# Patient Record
Sex: Female | Born: 1945 | Race: White | Hispanic: No | Marital: Married | Smoking: Never smoker
Health system: Southern US, Community
[De-identification: ages and names within clinical notes are randomized; demographics above are authoritative.]

## PROBLEM LIST (undated history)

## (undated) DIAGNOSIS — E7800 Pure hypercholesterolemia, unspecified: Secondary | ICD-10-CM

---

## 1999-07-24 ENCOUNTER — Ambulatory Visit (HOSPITAL_BASED_OUTPATIENT_CLINIC_OR_DEPARTMENT_OTHER): Admission: RE | Admit: 1999-07-24 | Discharge: 1999-07-24 | Payer: Self-pay | Admitting: Orthopedic Surgery

## 2013-02-04 ENCOUNTER — Ambulatory Visit (INDEPENDENT_AMBULATORY_CARE_PROVIDER_SITE_OTHER): Payer: BC Managed Care – PPO | Admitting: General Surgery

## 2013-02-28 ENCOUNTER — Encounter (HOSPITAL_COMMUNITY): Payer: Self-pay | Admitting: Emergency Medicine

## 2013-02-28 ENCOUNTER — Emergency Department (HOSPITAL_COMMUNITY)
Admission: EM | Admit: 2013-02-28 | Discharge: 2013-03-01 | Disposition: A | Discharge status: Home / Self Care | Payer: BC Managed Care – PPO | Attending: Emergency Medicine | Admitting: Emergency Medicine

## 2013-02-28 ENCOUNTER — Emergency Department (HOSPITAL_COMMUNITY): Payer: BC Managed Care – PPO

## 2013-02-28 DIAGNOSIS — R0602 Shortness of breath: Secondary | ICD-10-CM | POA: Insufficient documentation

## 2013-02-28 DIAGNOSIS — Z7982 Long term (current) use of aspirin: Secondary | ICD-10-CM | POA: Insufficient documentation

## 2013-02-28 DIAGNOSIS — R0989 Other specified symptoms and signs involving the circulatory and respiratory systems: Secondary | ICD-10-CM | POA: Insufficient documentation

## 2013-02-28 DIAGNOSIS — E7800 Pure hypercholesterolemia, unspecified: Secondary | ICD-10-CM | POA: Insufficient documentation

## 2013-02-28 DIAGNOSIS — R079 Chest pain, unspecified: Secondary | ICD-10-CM | POA: Insufficient documentation

## 2013-02-28 DIAGNOSIS — Z79899 Other long term (current) drug therapy: Secondary | ICD-10-CM | POA: Insufficient documentation

## 2013-02-28 DIAGNOSIS — R0609 Other forms of dyspnea: Secondary | ICD-10-CM | POA: Insufficient documentation

## 2013-02-28 DIAGNOSIS — Z880 Allergy status to penicillin: Secondary | ICD-10-CM | POA: Insufficient documentation

## 2013-02-28 DIAGNOSIS — R030 Elevated blood-pressure reading, without diagnosis of hypertension: Secondary | ICD-10-CM | POA: Insufficient documentation

## 2013-02-28 HISTORY — DX: Pure hypercholesterolemia, unspecified: E78.00

## 2013-02-28 LAB — CBC
HCT: 37 % (ref 36.0–46.0)
Hemoglobin: 12.9 g/dL (ref 12.0–15.0)
MCHC: 34.9 g/dL (ref 30.0–36.0)
MCV: 94.1 fL (ref 78.0–100.0)

## 2013-02-28 LAB — BASIC METABOLIC PANEL
BUN: 18 mg/dL (ref 6–23)
Creatinine, Ser: 0.71 mg/dL (ref 0.50–1.10)
GFR calc non Af Amer: 88 mL/min — ABNORMAL LOW (ref >90)
Glucose, Bld: 120 mg/dL — ABNORMAL HIGH (ref 70–99)
Potassium: 3.9 mEq/L (ref 3.5–5.1)

## 2013-02-28 NOTE — ED Provider Notes (Signed)
Patient had 1 minute of anterior chest pain this evening while bending over. No shortness of breath no nausea no sweatiness. Patient asymptomatic presently. No weakness. Her exam no distress lungs clear auscultation heart regular in rhythm no murmurs abdomen obese nontender  Doug Sou, MD 02/28/13 2355

## 2013-02-28 NOTE — ED Notes (Signed)
Pt reports 1 min episode of chest pressure 30 min ago.  Denies sob, nausea, and vomiting.  Reports generalized weakness.  Similar episode 3 weeks ago.

## 2013-02-28 NOTE — ED Provider Notes (Signed)
   History    CSN: [id number] Arrival date & time 02/28/13  2021  First MD Initiated Contact with Patient 02/28/13 2128     Chief Complaint  Patient presents with  . Chest Pain   (Consider location/radiation/quality/duration/timing/severity/associated sxs/prior Treatment) HPI Past Medical History  Diagnosis Date  . High cholesterol    History reviewed. No pertinent past surgical history. No family history on file. History  Substance Use Topics  . Smoking status: Never Smoker   . Smokeless tobacco: Not on file  . Alcohol Use: No   OB History   Grav Para Term Preterm Abortions TAB SAB Ect Mult Living                 Review of Systems  Allergies  Penicillins  Home Medications   Current Outpatient Rx  Name  Route  Sig  Dispense  Refill  . aspirin EC 81 MG tablet   Oral   Take 81 mg by mouth daily.         . diphenhydrAMINE (BENADRYL) 25 MG tablet   Oral   Take 50 mg by mouth at bedtime as needed for itching, allergies or sleep.         Marland Kitchen ibuprofen (ADVIL,MOTRIN) 200 MG tablet   Oral   Take 600 mg by mouth daily as needed for pain.         Marland Kitchen loratadine (CLARITIN) 10 MG tablet   Oral   Take 20 mg by mouth daily as needed for allergies.         . naproxen sodium (ANAPROX) 220 MG tablet   Oral   Take 440 mg by mouth daily as needed (pain).         . pravastatin (PRAVACHOL) 40 MG tablet   Oral   Take 40 mg by mouth daily.         Marland Kitchen triamcinolone cream (KENALOG) 0.1 %   Topical   Apply 1 application topically daily as needed (itching).          BP 152/86  Pulse 78  Temp(Src) 98 F (36.7 C) (Oral)  Resp 25  SpO2 96% Physical Exam  ED Course  Procedures (including critical care time) Labs Reviewed  BASIC METABOLIC PANEL - Abnormal; Notable for the following:    Glucose, Bld 120 (*)    GFR calc non Af Amer 88 (*)    All other components within normal limits  CBC  POCT I-STAT TROPONIN I   Dg Chest 2 View  02/28/2013   *RADIOLOGY  REPORT*  Clinical Data: Chest pressure  CHEST - 2 VIEW  Comparison: None.  Findings: Cardiac and mediastinal silhouettes are normal limits.  Lungs are well inflated.  No consolidation, pleural effusion, or pulmonary edema.  No pneumothorax.  Multilevel degenerative changes noted within the mid thoracic spine.  No acute osseous abnormality.  IMPRESSION: No acute cardiopulmonary process.   Original Report Authenticated By: Rise Mu, M.D.   No diagnosis found.  MDM  Duplicate noteuplicate note. Please see my other note from this encounter  Doug Sou, MD 03/01/13 [phone]

## 2013-03-01 LAB — POCT I-STAT TROPONIN I: Troponin i, poc: 0.01 ng/mL (ref 0.00–0.08)

## 2013-03-01 NOTE — ED Provider Notes (Signed)
History    CSN: [id number] Arrival date & time 02/28/13  2021  First MD Initiated Contact with Patient 02/28/13 2128     Chief Complaint  Patient presents with  . Chest Pain   (Consider location/radiation/quality/duration/timing/severity/associated sxs/prior Treatment) HPI Comments: Pt w/ PMHx of HLD now w/ chest pain. States acute onset while leaning forward to roll up window, substernal pressure, lasting 2 minutes and resolved spontaneously. A/w dyspnea. Similar sx 3 wks ago. Denies dyspnea or chest pain on exertion. No hx of CAD, no prior cath or stress test. No recent fever, cough or trauma. No hx of dvt/pe. On arrival to ED pt denies pain or any complaints.   Patient is a 67 y.o. female presenting with general illness. The history is provided by the patient. No language interpreter was used.  Illness Location:  Cardio/pulm Quality:  Chest pain Severity:  Moderate Onset quality:  Sudden Duration: 2 minutes. Timing:  Intermittent Progression:  Worsening Chronicity:  New Associated symptoms: chest pain and shortness of breath   Associated symptoms: no abdominal pain, no congestion, no cough, no diarrhea, no fever, no headaches, no nausea, no rash, no sore throat and no vomiting    Past Medical History  Diagnosis Date  . High cholesterol    History reviewed. No pertinent past surgical history. No family history on file. History  Substance Use Topics  . Smoking status: Never Smoker   . Smokeless tobacco: Not on file  . Alcohol Use: No   OB History   Grav Para Term Preterm Abortions TAB SAB Ect Mult Living                 Review of Systems  Constitutional: Negative for fever and chills.  HENT: Negative for congestion and sore throat.   Respiratory: Positive for shortness of breath. Negative for cough.   Cardiovascular: Positive for chest pain. Negative for leg swelling.  Gastrointestinal: Negative for nausea, vomiting, abdominal pain, diarrhea and constipation.   Genitourinary: Negative for dysuria and frequency.  Skin: Negative for color change and rash.  Neurological: Negative for dizziness and headaches.  Psychiatric/Behavioral: Negative for confusion and agitation.  All other systems reviewed and are negative.    Allergies  Penicillins  Home Medications   Current Outpatient Rx  Name  Route  Sig  Dispense  Refill  . aspirin EC 81 MG tablet   Oral   Take 81 mg by mouth daily.         . diphenhydrAMINE (BENADRYL) 25 MG tablet   Oral   Take 50 mg by mouth at bedtime as needed for itching, allergies or sleep.         Marland Kitchen ibuprofen (ADVIL,MOTRIN) 200 MG tablet   Oral   Take 600 mg by mouth daily as needed for pain.         Marland Kitchen loratadine (CLARITIN) 10 MG tablet   Oral   Take 20 mg by mouth daily as needed for allergies.         . naproxen sodium (ANAPROX) 220 MG tablet   Oral   Take 440 mg by mouth daily as needed (pain).         . pravastatin (PRAVACHOL) 40 MG tablet   Oral   Take 40 mg by mouth daily.         Marland Kitchen triamcinolone cream (KENALOG) 0.1 %   Topical   Apply 1 application topically daily as needed (itching).          BP  160/80  Pulse 88  Temp(Src) 98 F (36.7 C) (Oral)  Resp 26  SpO2 96% Physical Exam  Vitals reviewed. Constitutional: She is oriented to person, place, and time. She appears well-developed and well-nourished. No distress.  HENT:  Head: Normocephalic and atraumatic.  Eyes: EOM are normal. Pupils are equal, round, and reactive to light.  Neck: Normal range of motion. Neck supple.  Cardiovascular: Normal rate and regular rhythm.   Pulses:      Radial pulses are 2+ on the right side, and 2+ on the left side.  Pulmonary/Chest: Effort normal and breath sounds normal. No respiratory distress.  Abdominal: Soft. She exhibits no distension.  Musculoskeletal: Normal range of motion. She exhibits no edema.       Right lower leg: She exhibits no edema.       Left lower leg: She exhibits no  edema.  Neurological: She is alert and oriented to person, place, and time.  Skin: Skin is warm and dry.  Psychiatric: She has a normal mood and affect. Her behavior is normal.    ED Course  Procedures (including critical care time) Results for orders placed during the hospital encounter of 02/28/13  CBC      Result Value Range   WBC 8.9  4.0 - 10.5 K/uL   RBC 3.93  3.87 - 5.11 MIL/uL   Hemoglobin 12.9  12.0 - 15.0 g/dL   HCT 16.1  09.6 - 04.5 %   MCV 94.1  78.0 - 100.0 fL   MCH 32.8  26.0 - 34.0 pg   MCHC 34.9  30.0 - 36.0 g/dL   RDW 40.9  81.1 - 91.4 %   Platelets 292  150 - 400 K/uL  BASIC METABOLIC PANEL      Result Value Range   Sodium 140  135 - 145 mEq/L   Potassium 3.9  3.5 - 5.1 mEq/L   Chloride 102  96 - 112 mEq/L   CO2 26  19 - 32 mEq/L   Glucose, Bld 120 (*) 70 - 99 mg/dL   BUN 18  6 - 23 mg/dL   Creatinine, Ser 7.82  0.50 - 1.10 mg/dL   Calcium 9.5  8.4 - 95.6 mg/dL   GFR calc non Af Amer 88 (*) >90 mL/min   GFR calc Af Amer >90  >90 mL/min  POCT I-STAT TROPONIN I      Result Value Range   Troponin i, poc 0.02  0.00 - 0.08 ng/mL   Comment 3           POCT I-STAT TROPONIN I      Result Value Range   Troponin i, poc 0.01  0.00 - 0.08 ng/mL   Comment 3             Date: 03/01/2013  Rate: 97  Rhythm: normal sinus rhythm  QRS Axis: normal  Intervals: normal  ST/T Wave abnormalities: normal  Conduction Disutrbances:none  Narrative Interpretation: LVH  Old EKG Reviewed: none available    Dg Chest 2 View  02/28/2013   *RADIOLOGY REPORT*  Clinical Data: Chest pressure  CHEST - 2 VIEW  Comparison: None.  Findings: Cardiac and mediastinal silhouettes are normal limits.  Lungs are well inflated.  No consolidation, pleural effusion, or pulmonary edema.  No pneumothorax.  Multilevel degenerative changes noted within the mid thoracic spine.  No acute osseous abnormality.  IMPRESSION: No acute cardiopulmonary process.   Original Report Authenticated By: Rise Mu, M.D.   1. Chest pain  MDM  Exam as above, NAD, chest pain free, mild HTN. Exam benign. 3hr troponin x 2 negative, ECG w/out acute ischemia. CXR - NACPF, labs otherwise unremarkable. Does not appear to be ACS. Doubt PE - low risk Wells, PERC + only for age. Chest pain is atypical and pt is low risk - will recommend fup w/ pcp for HTN, given referral to cardiology for outpt stress test. Stable for d/c home. Given return precautions and follow up instructions.  I have personally reviewed labs and imaging and considered in my MDM. Case d/w Dr Ethelda Chick  1. Chest pain    [facility] CARDIOLOGY [address] [place] [address] [phone] Schedule an appointment as soon as possible for a visit for exercise stress test       Audelia Hives, MD 03/01/13 [phone]

## 2013-03-01 NOTE — ED Provider Notes (Signed)
I have personally seen and examined the patient.  I have discussed the plan of care with the resident.  I have reviewed the documentation on PMH/FH/Soc. History.  I have reviewed the documentation of the resident and agree.  Jahari Billy, MD 03/01/13 0146 

## 2013-08-19 ENCOUNTER — Other Ambulatory Visit: Payer: Self-pay | Admitting: Occupational Medicine

## 2013-08-19 ENCOUNTER — Ambulatory Visit: Payer: Self-pay

## 2013-08-19 DIAGNOSIS — M25562 Pain in left knee: Secondary | ICD-10-CM

## 2015-10-24 ENCOUNTER — Encounter (HOSPITAL_COMMUNITY): Payer: Self-pay

## 2015-10-24 ENCOUNTER — Emergency Department (HOSPITAL_COMMUNITY): Payer: Medicare PPO

## 2015-10-24 ENCOUNTER — Emergency Department (HOSPITAL_COMMUNITY)
Admission: EM | Admit: 2015-10-24 | Discharge: 2015-10-24 | Disposition: A | Discharge status: Home / Self Care | Payer: Medicare PPO | Attending: Emergency Medicine | Admitting: Emergency Medicine

## 2015-10-24 DIAGNOSIS — Z7982 Long term (current) use of aspirin: Secondary | ICD-10-CM | POA: Diagnosis not present

## 2015-10-24 DIAGNOSIS — J3489 Other specified disorders of nose and nasal sinuses: Secondary | ICD-10-CM | POA: Diagnosis not present

## 2015-10-24 DIAGNOSIS — R509 Fever, unspecified: Secondary | ICD-10-CM | POA: Insufficient documentation

## 2015-10-24 DIAGNOSIS — Z79899 Other long term (current) drug therapy: Secondary | ICD-10-CM | POA: Insufficient documentation

## 2015-10-24 DIAGNOSIS — Z7951 Long term (current) use of inhaled steroids: Secondary | ICD-10-CM | POA: Diagnosis not present

## 2015-10-24 DIAGNOSIS — R059 Cough, unspecified: Secondary | ICD-10-CM

## 2015-10-24 DIAGNOSIS — R05 Cough: Secondary | ICD-10-CM | POA: Diagnosis not present

## 2015-10-24 DIAGNOSIS — Z880 Allergy status to penicillin: Secondary | ICD-10-CM | POA: Insufficient documentation

## 2015-10-24 DIAGNOSIS — R0981 Nasal congestion: Secondary | ICD-10-CM | POA: Insufficient documentation

## 2015-10-24 DIAGNOSIS — E7800 Pure hypercholesterolemia, unspecified: Secondary | ICD-10-CM | POA: Insufficient documentation

## 2015-10-24 MED ORDER — FLUTICASONE PROPIONATE 50 MCG/ACT NA SUSP: 2.0000 | Freq: Every day | NASAL | Status: AC

## 2015-10-24 MED ORDER — AZITHROMYCIN 250 MG PO TABS: 250.0000 mg | ORAL_TABLET | Freq: Every day | ORAL | Status: AC

## 2015-10-24 MED ORDER — BENZONATATE 100 MG PO CAPS: 100.0000 mg | ORAL_CAPSULE | Freq: Three times a day (TID) | ORAL | Status: AC

## 2015-10-24 NOTE — ED Notes (Signed)
Called x 2.  No answer.  Checked in SW and main waiting.

## 2015-10-24 NOTE — Discharge Instructions (Signed)
You likely have a virus. However, I will give you a z-pack to hold on to. You may take it if your symptoms worsen or do not improve by the weekend. I will also give you a couple prescriptions to help with your congestion and cough.  Take medications as prescribed. Return to the emergency room for worsening condition or new concerning symptoms. Follow up with your regular doctor. If you don't have a regular doctor use one of the numbers below to establish a primary care doctor.   Emergency Department Resource Guide 1) Find a Doctor and Pay Out of Pocket Although you won't have to find out who is covered by your insurance plan, it is a good idea to ask around and get recommendations. You will then need to call the office and see if the doctor you have chosen will accept you as a new patient and what types of options they offer for patients who are self-pay. Some doctors offer discounts or will set up payment plans for their patients who do not have insurance, but you will need to ask so you aren't surprised when you get to your appointment.  2) Contact Your Local Health Department Not all health departments have doctors that can see patients for sick visits, but many do, so it is worth a call to see if yours does. If you don't know where your local health department is, you can check in your phone book. The CDC also has a tool to help you locate your state's health department, and many state websites also have listings of all of their local health departments.  3) Find a Walk-in Clinic If your illness is not likely to be very severe or complicated, you may want to try a walk in clinic. These are popping up all over the country in pharmacies, drugstores, and shopping centers. They're usually staffed by nurse practitioners or physician assistants that have been trained to treat common illnesses and complaints. They're usually fairly quick and inexpensive. However, if you have serious medical issues or  chronic medical problems, these are probably not your best option.  No Primary Care Doctor: - Call Health Connect at  [phone] - they can help you locate a primary care doctor that  accepts your insurance, provides certain services, etc. - Physician Referral [phone]  Emergency Department Resource Guide 1) Find a Doctor and Pay Out of Pocket Although you won't have to find out who is covered by your insurance plan, it is a good idea to ask around and get recommendations. You will then need to call the office and see if the doctor you have chosen will accept you as a new patient and what types of options they offer for patients who are self-pay. Some doctors offer discounts or will set up payment plans for their patients who do not have insurance, but you will need to ask so you aren't surprised when you get to your appointment.  2) Contact Your Local Health Department Not all health departments have doctors that can see patients for sick visits, but many do, so it is worth a call to see if yours does. If you don't know where your local health department is, you can check in your phone book. The CDC also has a tool to help you locate your state's health department, and many state websites also have listings of all of their local health departments.  3) Find a Walk-in Clinic If your illness is not likely to be very severe  or complicated, you may want to try a walk in clinic. These are popping up all over the country in pharmacies, drugstores, and shopping centers. They're usually staffed by nurse practitioners or physician assistants that have been trained to treat common illnesses and complaints. They're usually fairly quick and inexpensive. However, if you have serious medical issues or chronic medical problems, these are probably not your best option.  No Primary Care Doctor: - Call Health Connect at  [phone] - they can help you locate a primary care doctor that  accepts your  insurance, provides certain services, etc. - Physician Referral Service- [phone]  Chronic Pain Problems: Organization         Address  Phone   Notes  [facility]  [phone] Patients need to be referred by their primary care doctor.   Medication Assistance: Organization         Address  Phone   Notes  [facility] Medication [facility] [address] [place], [place] [address] [phone] --Must be a resident of [facility] -- Must have NO insurance coverage whatsoever (no Medicaid/ Medicare, etc.) -- The pt. MUST have a primary care doctor that directs their care regularly and follows them in the community   [facility]  [phone]   [employer]  [phone]    Agencies that provide inexpensive medical care: Organization         Address  Phone   Notes  [facility]  [phone]   [facility]    [phone]   [facility] [address] [place], [place] [address] [phone]   [facility] [address] [place], [place] [phone]   [facility]    [phone]   [facility]    [phone]   [facility]  [address], [place] Phone:  [phone], Fax:  [phone] Hours of Operation:  9 am - 6 pm, M-F.  Also accepts Medicaid/Medicare and self-pay.  [facility]  [address], [place] Phone: [phone], Fax: [phone]. Hours of Operation:  8:30 am - 5:30 pm, M-F.  Also accepts Medicaid and self-pay.  [facility] [place] [address], [place] Phone: [phone]   [facility] [address] Natasha Bence [place], [place] [phone], Ext. [phone] Mondays & Thursdays: 7-9 AM.  First 15 patients are seen on a first come, first serve basis.    Medicaid-accepting [facility] Providers:  Organization          Address  Phone   Notes  [facility] [address], [place] [phone] Also accepts self-pay patients.  [facility] [address] Laurell Josephs [place], [place]  [phone]   [facility] [address], [place] [phone]   [facility] [address], [place] [phone]   Renaye Rakers [address], [place]   [phone] Only accepts [place] patients after they have their name applied to their card.   Self-Pay (no insurance) in [facility]:  Organization         Address  Phone   Notes  Sickle Cell Patients, [facility] [address] [place], [place] [phone]   [facility] [address], [place] [phone]) [phone]   [facility] [place]  [address],  [phone]) [phone]   [facility]/Dr. Osei-Bonsu  [address] [place] [address], [place] or [phone], [place] [phone] Phone number for both [place] and [place] locations is the same.  [facility] [address], [place] [phone](336) [phone]   [facility] [address] [place] [address], [place] or [address] [phone](336) [phone]   [facility] [address], [place] [phone](336) [phone], phone; [phone]) [phone], fax Sees patients 1st and 3rd Saturday of every month.  Must not qualify for public or private insurance (i.e. Medicaid, Medicare, [place] Health Choice, Veterans' Benefits)  Household income should be no more than 200% of the poverty level The clinic cannot treat you if you are pregnant or think you are pregnant  Sexually transmitted diseases are not treated at the clinic.

## 2015-10-24 NOTE — ED Provider Notes (Signed)
CSN: [id number]     Arrival date & time 10/24/15  1340 History  By signing my name below, I, Emmanuella Mensah, attest that this documentation has been prepared under the direction and in the presence of Merary Garguilo, PA-C. Electronically Signed: Angelene Giovanni, ED Scribe. 10/24/2015. 2:55 PM.      Chief Complaint  Patient presents with  . Cough  . Nasal Congestion   The history is provided by the patient. No language interpreter was used.   HPI Comments: Audrey Nolan is a 70 y.o. female with a hx of high cholesterol who presents to the Emergency Department complaining of gradually worsening ongoing productive cough onset 2 days ago. She reports associated rhinorrhea, congestion, fever with Tmax of 102, and chills. She reports a hx of walking pneumonia twice and multiple sinus infections. She states that she has been taking Claritin and Benadryl at night per advise of her PCP. No alleviating factors noted. She reports an allergy to Penicillin. Her flu vaccine is UTD. No SOB. States typically when she gets "sick like this" her doctor gives her a z-pack and that alleviates her symptoms.   Past Medical History  Diagnosis Date  . High cholesterol    History reviewed. No pertinent past surgical history. No family history on file. Social History  Substance Use Topics  . Smoking status: Never Smoker   . Smokeless tobacco: None  . Alcohol Use: No   OB History    No data available     Review of Systems  Constitutional: Positive for fever and chills.  HENT: Positive for congestion and rhinorrhea.   Respiratory: Positive for cough. Negative for shortness of breath.   All other systems reviewed and are negative.     Allergies  Penicillins  Home Medications   Prior to Admission medications   Medication Sig Start Date End Date Taking? Authorizing Provider  aspirin EC 81 MG tablet Take 81 mg by mouth daily.    Historical Provider, MD  azithromycin (ZITHROMAX) 250 MG tablet Take 1  tablet (250 mg total) by mouth daily. Take first 2 tablets together, then 1 every day until finished. 10/24/15   Ace Gins Deshannon Seide, PA-C  benzonatate (TESSALON) 100 MG capsule Take 1 capsule (100 mg total) by mouth every 8 (eight) hours. 10/24/15   Ace Gins Kahne Helfand, PA-C  diphenhydrAMINE (BENADRYL) 25 MG tablet Take 50 mg by mouth at bedtime as needed for itching, allergies or sleep.    Historical Provider, MD  fluticasone (FLONASE) 50 MCG/ACT nasal spray Place 2 sprays into both nostrils daily. 10/24/15   Ace Gins Maxamillion Banas, PA-C  ibuprofen (ADVIL,MOTRIN) 200 MG tablet Take 600 mg by mouth daily as needed for pain.    Historical Provider, MD  loratadine (CLARITIN) 10 MG tablet Take 20 mg by mouth daily as needed for allergies.    Historical Provider, MD  naproxen sodium (ANAPROX) 220 MG tablet Take 440 mg by mouth daily as needed (pain).    Historical Provider, MD  pravastatin (PRAVACHOL) 40 MG tablet Take 40 mg by mouth daily.    Historical Provider, MD  triamcinolone cream (KENALOG) 0.1 % Apply 1 application topically daily as needed (itching).    Historical Provider, MD   BP 149/95 mmHg  Pulse 90  Temp(Src) 99.2 F (37.3 C) (Oral)  Resp 22  Ht  (1.549 m)  Wt 150 lb (68.04 kg)  BMI 28.36 kg/m2  SpO2 97% Physical Exam  Constitutional: She is oriented to person, place, and time. She appears  well-developed and well-nourished.  HENT:  Head: Normocephalic and atraumatic.  Right Ear: External ear normal.  Left Ear: External ear normal.  Nose: Nose normal. Right sinus exhibits no maxillary sinus tenderness and no frontal sinus tenderness. Left sinus exhibits no maxillary sinus tenderness and no frontal sinus tenderness.  Mouth/Throat: Oropharynx is clear and moist. No oropharyngeal exudate.  Eyes: Conjunctivae and EOM are normal.  Neck: Normal range of motion. Neck supple.  Cardiovascular: Normal rate, regular rhythm and normal heart sounds.   Pulmonary/Chest: Effort normal and breath sounds normal. No  respiratory distress. She has no wheezes. She has no rales.  Abdominal: Soft. She exhibits no distension. There is no tenderness.  Lymphadenopathy:    She has no cervical adenopathy.  Neurological: She is alert and oriented to person, place, and time.  Skin: Skin is warm and dry.  Psychiatric: She has a normal mood and affect.  Nursing note and vitals reviewed.   ED Course  Procedures (including critical care time) DIAGNOSTIC STUDIES: Oxygen Saturation is 97% on RA, normal by my interpretation.    COORDINATION OF CARE: 2:52 PM- Pt advised of plan for treatment and pt agrees. Pt informed of her x-ray results. Advised to continue the Claritin and Benadryl use. Pt will receive Flonase, Tessalon and a Z-pak. Recommended to fill the Z-pak only if pt's symptoms worsen within the next few days.   Imaging Review Dg Chest 2 View  10/24/2015  CLINICAL DATA:  Ongoing cough and congestion for a few weeks. Right posterior flank pain. EXAM: CHEST  2 VIEW COMPARISON:  Radiographs 02/28/2013. FINDINGS: The heart size and mediastinal contours are normal. The lungs are clear. There is no pleural effusion or pneumothorax. No acute osseous findings are identified. IMPRESSION: Stable chest.  No active cardiopulmonary process. Electronically Signed   By: Carey BullocksWilliam  Veazey M.D.   On: 10/24/2015 14:39     [name], PA-C has personally reviewed and evaluated these images as part of her medical decision-making.  MDM   Final diagnoses:  Sinus congestion  Cough    X-ray negative. Suspect viral URI/sinusitis. Pt is nontoxic with unremarkable exam and vitals. Given pt request I discussed with her that I can give her a prescription for azithromycin to hold on to and take only if her symptoms do not improve over the next few days. She has claritin and benadryl at home which she takes for chronic sinus issues. Will also give rx for flonase and tessalon. Instructed to f/u with PCP. ER return precautions given.  I  personally performed the services described in this documentation, which was scribed in my presence. The recorded information has been reviewed and is accurate.   Carlene CoriaSerena Y Rema Lievanos, PA-C 10/24/15 1704  Doug SouSam Jacubowitz, MD 10/25/15 [phone]

## 2015-10-24 NOTE — ED Notes (Signed)
Patient returned from xray.

## 2015-10-24 NOTE — ED Notes (Signed)
Patient here with ongoing cough and congestion for a few weeks, no distess

## 2016-10-15 ENCOUNTER — Encounter (INDEPENDENT_AMBULATORY_CARE_PROVIDER_SITE_OTHER): Payer: Self-pay | Admitting: Ophthalmology

## 2016-10-28 ENCOUNTER — Encounter (INDEPENDENT_AMBULATORY_CARE_PROVIDER_SITE_OTHER): Payer: Self-pay | Admitting: Ophthalmology

## 2016-11-21 ENCOUNTER — Encounter (INDEPENDENT_AMBULATORY_CARE_PROVIDER_SITE_OTHER): Payer: Self-pay | Admitting: Ophthalmology

## 2016-11-25 ENCOUNTER — Encounter (INDEPENDENT_AMBULATORY_CARE_PROVIDER_SITE_OTHER): Payer: Self-pay | Admitting: Ophthalmology

## 2017-06-02 ENCOUNTER — Ambulatory Visit: Payer: Self-pay | Admitting: Family Medicine

## 2017-06-02 DIAGNOSIS — R030 Elevated blood-pressure reading, without diagnosis of hypertension: Secondary | ICD-10-CM | POA: Diagnosis not present

## 2017-06-02 DIAGNOSIS — Z79899 Other long term (current) drug therapy: Secondary | ICD-10-CM | POA: Diagnosis not present

## 2017-06-02 DIAGNOSIS — S40869A Insect bite (nonvenomous) of unspecified upper arm, initial encounter: Secondary | ICD-10-CM | POA: Diagnosis not present

## 2017-06-23 DIAGNOSIS — E669 Obesity, unspecified: Secondary | ICD-10-CM | POA: Diagnosis not present

## 2017-06-23 DIAGNOSIS — E559 Vitamin D deficiency, unspecified: Secondary | ICD-10-CM | POA: Diagnosis not present

## 2017-06-23 DIAGNOSIS — Z79899 Other long term (current) drug therapy: Secondary | ICD-10-CM | POA: Diagnosis not present

## 2017-06-23 DIAGNOSIS — R030 Elevated blood-pressure reading, without diagnosis of hypertension: Secondary | ICD-10-CM | POA: Diagnosis not present

## 2017-06-23 DIAGNOSIS — Z0000 Encounter for general adult medical examination without abnormal findings: Secondary | ICD-10-CM | POA: Diagnosis not present

## 2017-06-23 DIAGNOSIS — R21 Rash and other nonspecific skin eruption: Secondary | ICD-10-CM | POA: Diagnosis not present

## 2018-03-13 ENCOUNTER — Emergency Department (HOSPITAL_COMMUNITY)
Admission: EM | Admit: 2018-03-13 | Discharge: 2018-03-14 | Disposition: A | Discharge status: Home / Self Care | Payer: Medicare PPO | Attending: Emergency Medicine | Admitting: Emergency Medicine

## 2018-03-13 ENCOUNTER — Other Ambulatory Visit: Payer: Self-pay

## 2018-03-13 ENCOUNTER — Emergency Department (HOSPITAL_COMMUNITY): Payer: Medicare PPO

## 2018-03-13 DIAGNOSIS — I10 Essential (primary) hypertension: Secondary | ICD-10-CM | POA: Diagnosis not present

## 2018-03-13 DIAGNOSIS — I160 Hypertensive urgency: Secondary | ICD-10-CM | POA: Diagnosis not present

## 2018-03-13 DIAGNOSIS — Z7982 Long term (current) use of aspirin: Secondary | ICD-10-CM | POA: Insufficient documentation

## 2018-03-13 DIAGNOSIS — Z79899 Other long term (current) drug therapy: Secondary | ICD-10-CM | POA: Diagnosis not present

## 2018-03-13 LAB — I-STAT TROPONIN, ED: TROPONIN I, POC: 0.01 ng/mL (ref 0.00–0.08)

## 2018-03-13 LAB — BASIC METABOLIC PANEL
ANION GAP: 10 (ref 5–15)
BUN: 19 mg/dL (ref 8–23)
CALCIUM: 9.5 mg/dL (ref 8.9–10.3)
CO2: 24 mmol/L (ref 22–32)
CREATININE: 0.75 mg/dL (ref 0.44–1.00)
Chloride: 105 mmol/L (ref 98–111)
GFR calc Af Amer: >60 mL/min (ref >60)
Glucose, Bld: 171 mg/dL — ABNORMAL HIGH (ref 70–99)
Potassium: 3.7 mmol/L (ref 3.5–5.1)
SODIUM: 139 mmol/L (ref 135–145)

## 2018-03-13 LAB — CBC
HCT: 39 % (ref 36.0–46.0)
Hemoglobin: 12.7 g/dL (ref 12.0–15.0)
MCH: 31.5 pg (ref 26.0–34.0)
MCHC: 32.6 g/dL (ref 30.0–36.0)
MCV: 96.8 fL (ref 78.0–100.0)
PLATELETS: 324 10*3/uL (ref 150–400)
RBC: 4.03 MIL/uL (ref 3.87–5.11)
RDW: 12 % (ref 11.5–15.5)
WBC: 8.6 10*3/uL (ref 4.0–10.5)

## 2018-03-13 NOTE — ED Triage Notes (Signed)
Pt stated she has been having high blood pressures and having left arm pain.No SOB. Alert oriented x 4. No nausea or vomiting, no sweats, feeling weak.

## 2018-03-13 NOTE — ED Notes (Signed)
Pt requesting pain medication.  

## 2018-03-14 DIAGNOSIS — I160 Hypertensive urgency: Secondary | ICD-10-CM | POA: Diagnosis not present

## 2018-03-14 MED ORDER — AMLODIPINE BESYLATE 10 MG PO TABS: 10.0000 mg | ORAL_TABLET | Freq: Every day | ORAL | 0 refills | Status: AC

## 2018-03-14 MED ORDER — CLONIDINE HCL 0.1 MG PO TABS
0.1000 mg | ORAL_TABLET | Freq: Once | ORAL | Status: AC
  Administered 2018-03-14: 0.1 mg via ORAL
  Filled 2018-03-14: qty 1

## 2018-03-14 MED ORDER — AMLODIPINE BESYLATE 10 MG PO TABS: 10.0000 mg | ORAL_TABLET | Freq: Every day | ORAL | 0 refills | Status: DC

## 2018-03-14 NOTE — Discharge Instructions (Signed)
Begin taking amlodipine as prescribed.  Keep a record of your blood pressure several times daily until followed up by a primary physician.  Follow-up with a primary doctor in the next few weeks to discuss the results of your blood pressure log.

## 2018-03-14 NOTE — ED Provider Notes (Signed)
[facility] Note   CSN: [id number] Arrival date & time: 03/13/18  1756     History   Chief Complaint Chief Complaint  Patient presents with  . Hypertension    HPI Audrey MccreedySusan Nolan is a 72 y.o. female.  Patient is a [age] year old female with past medical history of hypertension.  She presents today with complaints of elevated blood pressure and generalized malaise.  She has not felt well for the past several days.  This evening she checked her blood pressure and it was over 200.  Her son became very concerned and made her come to the ER to be checked out.  She describes discomfort in her shoulders, but denies any chest pain or difficulty breathing.  She is supposed to be taking blood pressure medication, however her primary doctor has retired and she has not seen a physician in quite some time.  The history is provided by the patient.  Hypertension  This is a new problem. The problem occurs constantly. The problem has been gradually worsening. Nothing aggravates the symptoms. Nothing relieves the symptoms. She has tried nothing for the symptoms.    Past Medical History:  Diagnosis Date  . High cholesterol     There are no active problems to display for this patient.   No past surgical history on file.   OB History   None      Home Medications    Prior to Admission medications   Medication Sig Start Date End Date Taking? Authorizing Provider  aspirin EC 81 MG tablet Take 81 mg by mouth daily.    [provider]  azithromycin (ZITHROMAX) 250 MG tablet Take 1 tablet (250 mg total) by mouth daily. Take first 2 tablets together, then 1 every day until finished. 10/24/15   Sam, Ace GinsSerena Y, PA-C  benzonatate (TESSALON) 100 MG capsule Take 1 capsule (100 mg total) by mouth every 8 (eight) hours. 10/24/15   Sam, Ace GinsSerena Y, PA-C  diphenhydrAMINE (BENADRYL) 25 MG tablet Take 50 mg by mouth at bedtime as needed for itching, allergies  or sleep.    [provider]  fluticasone (FLONASE) 50 MCG/ACT nasal spray Place 2 sprays into both nostrils daily. 10/24/15   Sam, Ace GinsSerena Y, PA-C  ibuprofen (ADVIL,MOTRIN) 200 MG tablet Take 600 mg by mouth daily as needed for pain.    [provider]  loratadine (CLARITIN) 10 MG tablet Take 20 mg by mouth daily as needed for allergies.    [provider]  naproxen sodium (ANAPROX) 220 MG tablet Take 440 mg by mouth daily as needed (pain).    [provider]  pravastatin (PRAVACHOL) 40 MG tablet Take 40 mg by mouth daily.    [provider]  triamcinolone cream (KENALOG) 0.1 % Apply 1 application topically daily as needed (itching).    [provider]    Family History No family history on file.  Social History Social History   Tobacco Use  . Smoking status: Never Smoker  Substance Use Topics  . Alcohol use: No  . Drug use: No     Allergies   Penicillins   Review of Systems Review of Systems  All other systems reviewed and are negative.    Physical Exam Updated Vital Signs BP 122/80   Pulse 81   Temp 97.8 F (36.6 C) (Oral)   Resp 17   Ht 5\' 1"  (1.549 m)   Wt 67.6 kg (149 lb)   SpO2 94%  BMI 28.15 kg/m   Physical Exam  Constitutional: She is oriented to person, place, and time. She appears well-developed and well-nourished. No distress.  HENT:  Head: Normocephalic and atraumatic.  Eyes: Pupils are equal, round, and reactive to light. EOM are normal.  Neck: Normal range of motion. Neck supple.  Cardiovascular: Normal rate and regular rhythm. Exam reveals no gallop and no friction rub.  No murmur heard. Pulmonary/Chest: Effort normal and breath sounds normal. No respiratory distress. She has no wheezes.  Abdominal: Soft. Bowel sounds are normal. She exhibits no distension. There is no tenderness.  Musculoskeletal: Normal range of motion.  Neurological: She is alert and oriented to person, place, and time. No  cranial nerve deficit. She exhibits normal muscle tone. Coordination normal.  Skin: Skin is warm and dry. She is not diaphoretic.  Nursing note and vitals reviewed.    ED Treatments / Results  Labs (all labs ordered are listed, but only abnormal results are displayed) Labs Reviewed  BASIC METABOLIC PANEL - Abnormal; Notable for the following components:      Result Value   Glucose, Bld 171 (*)    All other components within normal limits  CBC  I-STAT TROPONIN, ED    EKG EKG Interpretation  Date/Time:  Friday March 13 2018 18:40:04 EDT Ventricular Rate:  86 PR Interval:  134 QRS Duration: 86 QT Interval:  368 QTC Calculation: 440 R Axis:   16 Text Interpretation:  Sinus rhythm with occasional Premature ventricular complexes Nonspecific ST and T wave abnormality Abnormal ECG Confirmed by Geoffery Lyons ([id number]) on 03/14/2018 12:42:53 AM   Radiology Dg Chest 2 View  Result Date: 03/13/2018 CLINICAL DATA:  Hypertension EXAM: CHEST - 2 VIEW COMPARISON:  10/24/2015 FINDINGS: Lungs are hyperexpanded. The lungs are clear without focal pneumonia, edema, pneumothorax or pleural effusion. Cardiopericardial silhouette is at upper limits of normal for size. The visualized bony structures of the thorax are intact. IMPRESSION: No active cardiopulmonary disease. Electronically Signed   By: Kennith Center M.D.   On: 03/13/2018 19:17    Procedures Procedures (including critical care time)  Medications Ordered in ED Medications  cloNIDine (CATAPRES) tablet 0.1 mg (has no administration in time range)     Initial Impression / Assessment and Plan / ED Course  I have reviewed the triage vital signs and the nursing notes.  Pertinent labs & imaging results that were available during my care of the patient were reviewed by me and considered in my medical decision making (see chart for details).  Patient presents with complaints of generalized malaise and elevated blood pressure.  Her work-up  shows no sign of endorgan damage and her physical examination is unremarkable.  Her blood pressure has improved with a small dose of clonidine.  I see no indication for admission.  She has not seen a physician in light some time and does not have a primary doctor.  She tells me this is in the works and I have advised her to keep a record of her blood pressures to take to her doctor's appointment so they can prescribe medications as indicated.  She is insistent upon something to be started this evening.  I have agreed to prescribe amlodipine 10 mg.  Final Clinical Impressions(s) / ED Diagnoses   Final diagnoses:  None    ED Discharge Orders    None       Geoffery Lyons, MD 03/14/18 [id number]

## 2018-07-27 DIAGNOSIS — I10 Essential (primary) hypertension: Secondary | ICD-10-CM | POA: Diagnosis not present

## 2018-07-27 DIAGNOSIS — E785 Hyperlipidemia, unspecified: Secondary | ICD-10-CM | POA: Diagnosis not present

## 2018-07-27 DIAGNOSIS — E559 Vitamin D deficiency, unspecified: Secondary | ICD-10-CM | POA: Diagnosis not present

## 2018-07-27 DIAGNOSIS — Z0000 Encounter for general adult medical examination without abnormal findings: Secondary | ICD-10-CM | POA: Diagnosis not present

## 2018-07-27 DIAGNOSIS — R7301 Impaired fasting glucose: Secondary | ICD-10-CM | POA: Diagnosis not present

## 2018-07-27 DIAGNOSIS — E039 Hypothyroidism, unspecified: Secondary | ICD-10-CM | POA: Diagnosis not present

## 2018-08-10 ENCOUNTER — Other Ambulatory Visit: Payer: Self-pay | Admitting: Family

## 2018-08-10 ENCOUNTER — Ambulatory Visit
Admission: RE | Admit: 2018-08-10 | Discharge: 2018-08-10 | Disposition: A | Discharge status: Home / Self Care | Payer: Medicare PPO | Source: Ambulatory Visit | Attending: Family | Admitting: Family

## 2018-08-10 DIAGNOSIS — L989 Disorder of the skin and subcutaneous tissue, unspecified: Secondary | ICD-10-CM | POA: Diagnosis not present

## 2018-08-10 DIAGNOSIS — M5116 Intervertebral disc disorders with radiculopathy, lumbar region: Secondary | ICD-10-CM | POA: Diagnosis not present

## 2018-08-10 DIAGNOSIS — E785 Hyperlipidemia, unspecified: Secondary | ICD-10-CM | POA: Diagnosis not present

## 2018-08-10 DIAGNOSIS — M5416 Radiculopathy, lumbar region: Secondary | ICD-10-CM | POA: Diagnosis not present

## 2018-08-10 DIAGNOSIS — I10 Essential (primary) hypertension: Secondary | ICD-10-CM | POA: Diagnosis not present

## 2018-08-10 DIAGNOSIS — E559 Vitamin D deficiency, unspecified: Secondary | ICD-10-CM | POA: Diagnosis not present

## 2018-08-10 DIAGNOSIS — R202 Paresthesia of skin: Secondary | ICD-10-CM | POA: Diagnosis not present

## 2019-02-22 ENCOUNTER — Encounter (INDEPENDENT_AMBULATORY_CARE_PROVIDER_SITE_OTHER): Payer: Self-pay | Admitting: Ophthalmology

## 2019-03-03 ENCOUNTER — Encounter (INDEPENDENT_AMBULATORY_CARE_PROVIDER_SITE_OTHER): Payer: Self-pay | Admitting: Ophthalmology

## 2019-03-31 ENCOUNTER — Encounter (INDEPENDENT_AMBULATORY_CARE_PROVIDER_SITE_OTHER): Payer: Self-pay | Admitting: Ophthalmology

## 2019-10-08 ENCOUNTER — Ambulatory Visit: Payer: Self-pay

## 2020-07-19 ENCOUNTER — Other Ambulatory Visit: Payer: Self-pay | Admitting: Student

## 2020-07-19 ENCOUNTER — Ambulatory Visit
Admission: RE | Admit: 2020-07-19 | Discharge: 2020-07-19 | Disposition: A | Discharge status: Home / Self Care | Payer: Medicare PPO | Source: Ambulatory Visit | Attending: Student | Admitting: Student

## 2020-07-19 DIAGNOSIS — S93402A Sprain of unspecified ligament of left ankle, initial encounter: Secondary | ICD-10-CM

## 2021-05-16 ENCOUNTER — Other Ambulatory Visit: Payer: Self-pay | Admitting: Student

## 2021-05-16 DIAGNOSIS — E2839 Other primary ovarian failure: Secondary | ICD-10-CM

## 2021-05-25 ENCOUNTER — Other Ambulatory Visit: Payer: Self-pay | Admitting: Student

## 2021-05-25 DIAGNOSIS — Z780 Asymptomatic menopausal state: Secondary | ICD-10-CM

## 2021-06-15 ENCOUNTER — Other Ambulatory Visit: Payer: Self-pay | Admitting: Student

## 2021-06-15 DIAGNOSIS — Z1231 Encounter for screening mammogram for malignant neoplasm of breast: Secondary | ICD-10-CM

## 2021-10-09 IMAGING — DX DG FOOT 2V*L*
2 series · 2 of 2 positions shown · non-contrast
Comparison: Ankle series today

CLINICAL DATA: Injury, lateral foot and ankle pain.

EXAM:
LEFT FOOT - 2 VIEW

[dg foot 2 views left (1 of 2)]
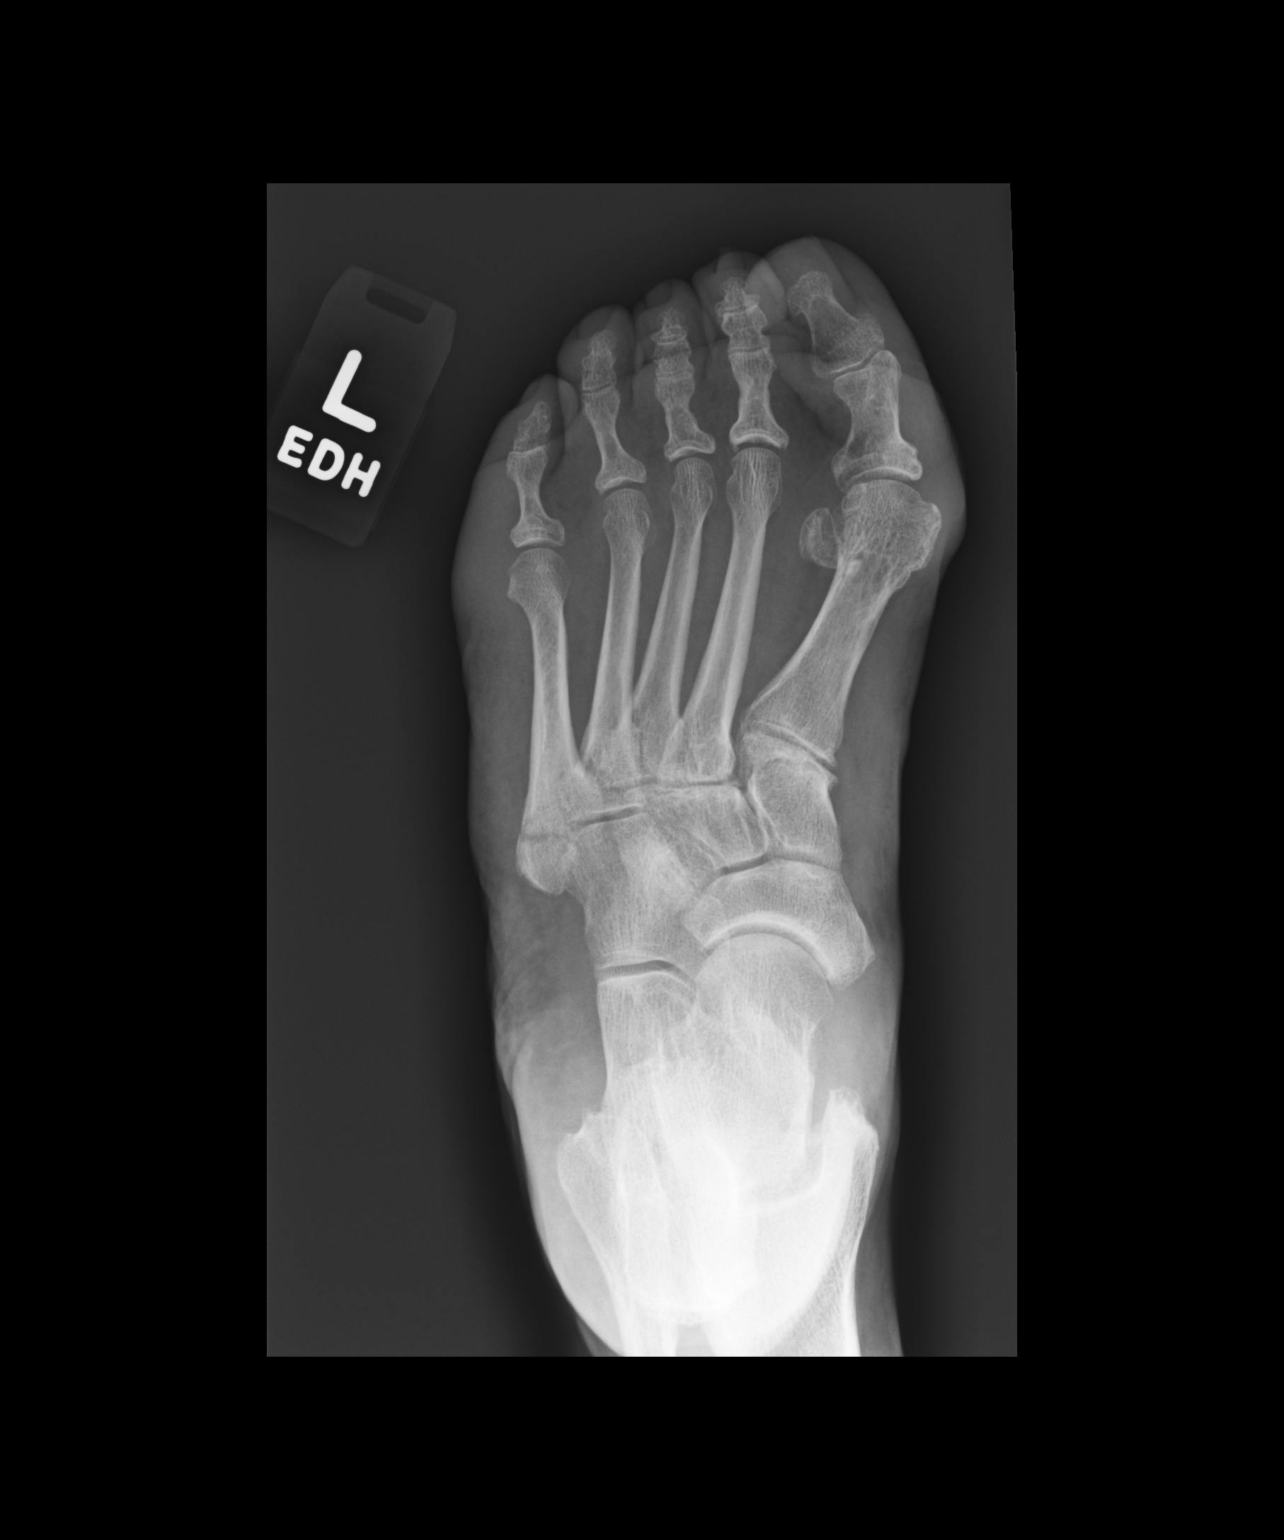

[dg foot 2 views left (2 of 2)]
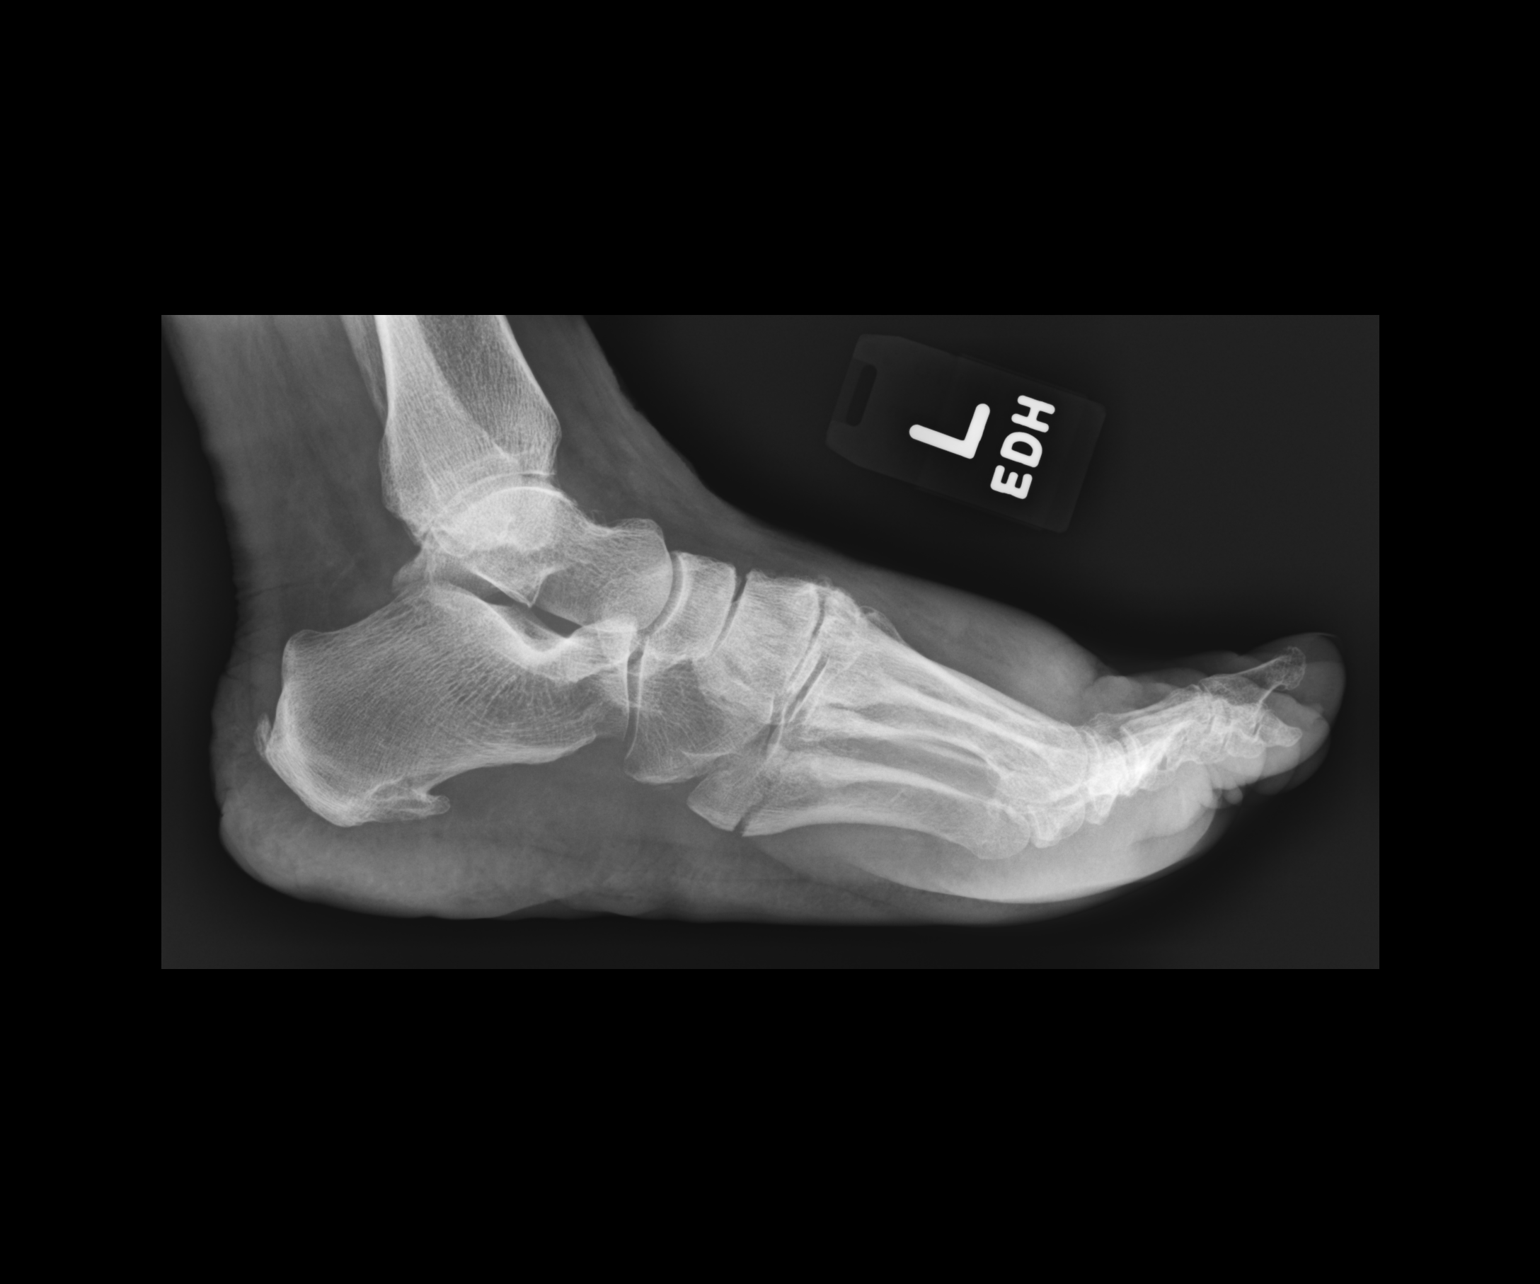

[2 of 2 positions shown; findings below may reference images not displayed]

FINDINGS: There is a minimally displaced fracture at the base of the left 5th
metatarsal. Overlying soft tissue swelling. No additional fracture.
No subluxation or dislocation. Mild hallux valgus deformity at the
1st MTP.
IMPRESSION: Minimally displaced fracture at the base of the left 5th metatarsal

## 2021-10-29 DIAGNOSIS — Z79899 Other long term (current) drug therapy: Secondary | ICD-10-CM | POA: Diagnosis not present

## 2021-12-18 ENCOUNTER — Other Ambulatory Visit: Payer: Self-pay | Admitting: Student

## 2021-12-18 ENCOUNTER — Ambulatory Visit
Admission: RE | Admit: 2021-12-18 | Discharge: 2021-12-18 | Disposition: A | Discharge status: Home / Self Care | Payer: Medicare HMO | Source: Ambulatory Visit | Attending: Student | Admitting: Student

## 2021-12-18 DIAGNOSIS — R52 Pain, unspecified: Secondary | ICD-10-CM

## 2022-02-08 ENCOUNTER — Other Ambulatory Visit: Payer: Medicare PPO

## 2022-02-08 ENCOUNTER — Ambulatory Visit
Admission: RE | Admit: 2022-02-08 | Discharge: 2022-02-08 | Disposition: A | Discharge status: Home / Self Care | Payer: Medicare HMO | Source: Ambulatory Visit | Attending: Student | Admitting: Student

## 2022-02-08 ENCOUNTER — Ambulatory Visit: Payer: Medicare PPO

## 2022-02-08 DIAGNOSIS — Z780 Asymptomatic menopausal state: Secondary | ICD-10-CM

## 2022-03-15 ENCOUNTER — Ambulatory Visit: Payer: Medicare HMO

## 2022-05-10 ENCOUNTER — Ambulatory Visit: Payer: Medicare HMO

## 2022-05-24 ENCOUNTER — Ambulatory Visit: Payer: Medicare HMO

## 2023-03-10 IMAGING — CR DG FINGER MIDDLE 2+V*R*
3 series · 3 of 3 positions shown · non-contrast
Comparison: None Available.

CLINICAL DATA: Pain, tenderness, and swelling involving the right
long finger for 2-3 days.

EXAM:
RIGHT MIDDLE FINGER 2+V

[x finger pa right]
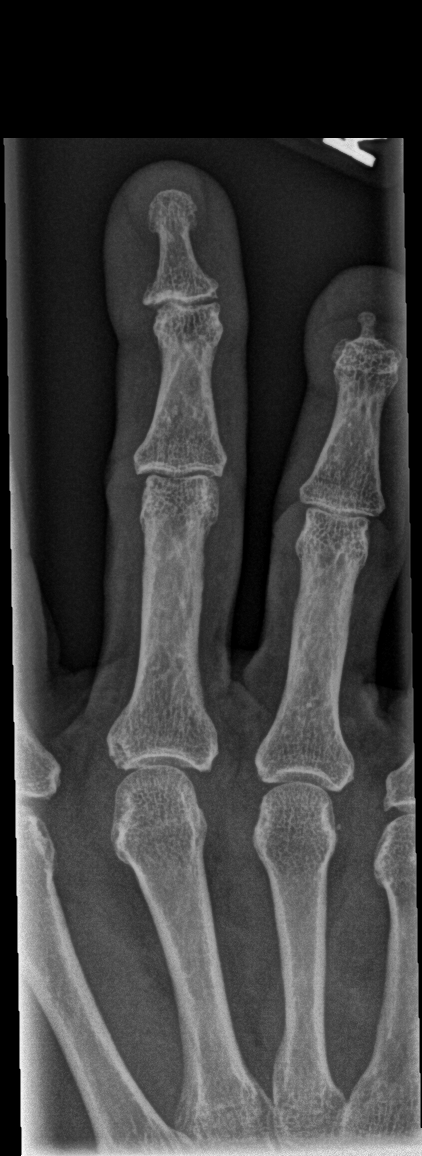

[x finger obl right]
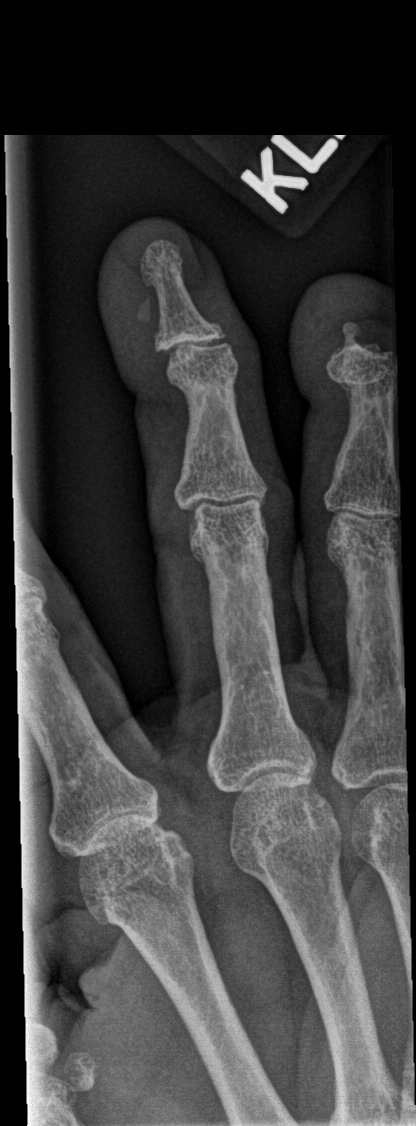

[x finger lat right]
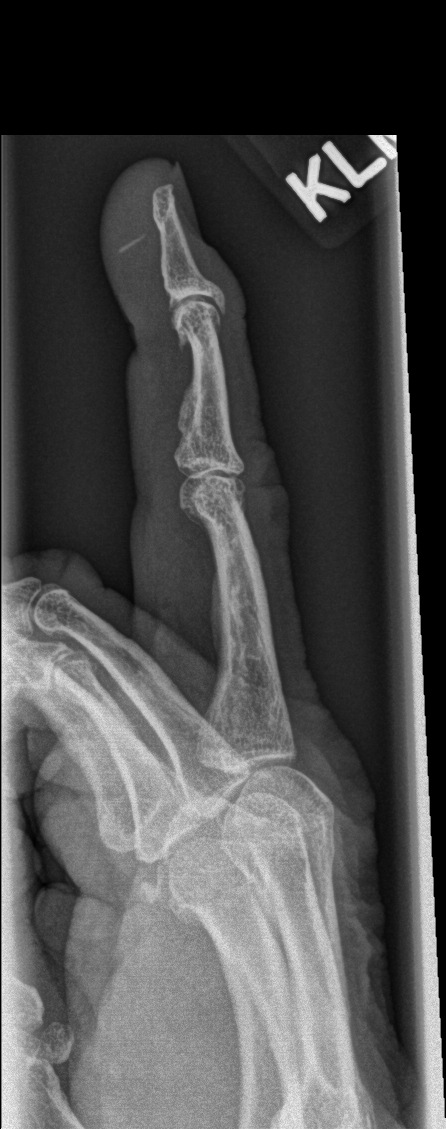

[3 of 3 positions shown; findings below may reference images not displayed]

FINDINGS: No acute fracture or dislocation is identified. There is a 4 mm long
density in the volar soft tissues at the level of the distal phalanx
which is indeterminate for a foreign body versus a focus of soft
tissue calcification. Regional soft tissue swelling is noted.
Prominent dorsal spurring is noted at the DIP joint with associated
mild joint space narrowing. Limited assessment of the ring finger
demonstrates a truncated/deficient appearance of the distal phalanx
which appears chronic.
IMPRESSION: 1. Possible 4 mm foreign body in the distal soft tissues of the
right long finger.
2. No acute osseous abnormality identified.

## 2023-03-14 ENCOUNTER — Other Ambulatory Visit: Payer: Self-pay | Admitting: Family Medicine

## 2023-03-14 ENCOUNTER — Ambulatory Visit
Admission: RE | Admit: 2023-03-14 | Discharge: 2023-03-14 | Disposition: A | Discharge status: Home / Self Care | Payer: Medicare HMO | Source: Ambulatory Visit | Attending: Family Medicine | Admitting: Family Medicine

## 2023-03-14 DIAGNOSIS — M5442 Lumbago with sciatica, left side: Secondary | ICD-10-CM
